# Patient Record
Sex: Male | Born: 1995 | Race: Black or African American | Hispanic: No | Marital: Single | Smoking: Current every day smoker
Health system: Southern US, Community
[De-identification: ages and names within clinical notes are randomized; demographics above are authoritative.]

## PROBLEM LIST (undated history)

## (undated) DIAGNOSIS — R51 Headache: Secondary | ICD-10-CM

## (undated) DIAGNOSIS — R519 Headache, unspecified: Secondary | ICD-10-CM

---
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
## 2015-02-05 ENCOUNTER — Encounter (HOSPITAL_COMMUNITY): Payer: Self-pay | Admitting: Emergency Medicine

## 2015-02-05 ENCOUNTER — Emergency Department (HOSPITAL_COMMUNITY)
Admission: EM | Admit: 2015-02-05 | Discharge: 2015-02-05 | Disposition: A | Discharge status: Home / Self Care | Payer: Medicaid Other | Attending: Emergency Medicine | Admitting: Emergency Medicine

## 2015-02-05 DIAGNOSIS — B86 Scabies: Secondary | ICD-10-CM | POA: Diagnosis not present

## 2015-02-05 DIAGNOSIS — Z720 Tobacco use: Secondary | ICD-10-CM | POA: Insufficient documentation

## 2015-02-05 DIAGNOSIS — R21 Rash and other nonspecific skin eruption: Secondary | ICD-10-CM | POA: Diagnosis present

## 2015-02-05 MED ORDER — PERMETHRIN 5 % EX CREA: TOPICAL_CREAM | CUTANEOUS | Status: AC

## 2015-02-05 NOTE — Discharge Instructions (Signed)
Scabies Scabies are small bugs (mites) that burrow under the skin and cause red bumps and severe itching. These bugs can only be seen with a microscope. Scabies are highly contagious. They can spread easily from person to person by direct contact. They are also spread through sharing clothing or linens that have the scabies mites living in them. It is not unusual for an entire family to become infected through shared towels, clothing, or bedding.  HOME CARE INSTRUCTIONS   Your caregiver may prescribe a cream or lotion to kill the mites. If cream is prescribed, massage the cream into the entire body from the neck to the bottom of both feet. Also massage the cream into the scalp and face if your child is less than 19 year old. Avoid the eyes and mouth. Do not wash your hands after application.  Leave the cream on for 8 to 12 hours. Your child should bathe or shower after the 8 to 12 hour application period. Sometimes it is helpful to apply the cream to your child right before bedtime.  One treatment is usually effective and will eliminate approximately 95% of infestations. For severe cases, your caregiver may decide to repeat the treatment in 1 week. Everyone in your household should be treated with one application of the cream.  New rashes or burrows should not appear within 24 to 48 hours after successful treatment. However, the itching and rash may last for 2 to 4 weeks after successful treatment. Your caregiver may prescribe a medicine to help with the itching or to help the rash go away more quickly.  Scabies can live on clothing or linens for up to 3 days. All of your child's recently used clothing, towels, stuffed toys, and bed linens should be washed in hot water and then dried in a dryer for at least 20 minutes on high heat. Items that cannot be washed should be enclosed in a plastic bag for at least 3 days.  To help relieve itching, bathe your child in a cool bath or apply cool washcloths to the  affected areas.  Your child may return to school after treatment with the prescribed cream. SEEK MEDICAL CARE IF:   The itching persists longer than 4 weeks after treatment.  The rash spreads or becomes infected. Signs of infection include red blisters or yellow-tan crust. Document Released: 08/12/2005 Document Revised: 11/04/2011 Document Reviewed: 12/21/2008 [facility] Patient Information 2015 [place], [place]. This information is not intended to replace advice given to you by your health care provider. Make sure you discuss any questions you have with your health care provider.  RESOURCE GUIDE  Chronic Pain Problems: Contact [facility]  [phone] Patients need to be referred by their primary care doctor.  Insufficient Money for Medicine: Contact United Way:  call "211" or [place] [phone].  No Primary Care Doctor: Call Health Connect  [phone] - can help you locate a primary care doctor that  accepts your insurance, provides certain services, etc. Physician Referral [phone]  Agencies that provide inexpensive medical care: [facility]  [facility]  [phone] [facility]  [phone] [facility]  [phone] Planned Parenthood  [phone] [facility]  [phone]  [place] Providers: [facility]- [address], [address]  [phone], Mon-Fri 9am-7pm, Sat 9am-1pm [place], [address] [place], [address]  [place]  [place], [address], [phone]  Only accepts [place] patients after they have their name  applied to their card  Self Pay (no insurance) in [facility]: Sickle Cell Patients: Dr Kevan Ny, [facility]  Medicine  [place], [facility]- [facility]  [facility] [place]- [address], [facility]- see information above (Speak to [employer] if you do not have insurance)       -  [facility]- [place], [place],  [place], [place]  Dr Vista Lawman-  [address], [place], [facility]- [address] [phone]       -  [facility]- [address], [place], also [address], [phone]       -    [facility]- [address], [place], 1st & 3rd Saturday   every month, 10am-1pm  1) Find a Doctor and Pay Out of Pocket Although you won't have to find out who is covered by your insurance plan, it is a good idea to ask around and get recommendations. You will then need to call the office and see if the doctor you have chosen will accept you as a new patient and what types of options they offer for patients who are self-pay. Some doctors offer discounts or will set up payment plans for their patients who do not have insurance, but you will need to ask so you aren't surprised when you get to your appointment.  2) Contact Your Local Health Department Not all health departments have doctors that can see patients for sick visits, but many do, so it is worth a call to see if yours does. If you don't know where your local health department is, you can check in your phone book. The CDC also has a tool to help you locate your state's health department, and many state websites also have listings of all of their local health departments.  3) Find a [facility] If your illness is not likely to be very severe or complicated, you may want to try a walk in clinic. These are popping up all over the  country in pharmacies, drugstores, and shopping centers. They're usually staffed by nurse practitioners or physician assistants that have been trained to treat common illnesses and complaints. They're usually fairly quick and inexpensive. However, if you have serious medical issues or chronic medical problems, these are probably not your best option  [facility] [place], [facility], [address] [place], phone [phone] or [phone].  Monday - Friday, call for an appointment. [place], [facility] Clinic, [address], [place], phone [phone] or [phone].  Monday - Friday, call for an appointment.  Abuse/Neglect: [place] [phone] [place] [phone] (After Hours)  Emergency Shelter:  [facility] [phone]  Maternity Homes: Room at the [place] [phone] [place] [phone]  MRSA Hotline #:   857-379-6564  [facility] Dept. [address]         [place] Phone:  [phone]                                  Phone:  [phone]                   Phone:  [phone]  [facility], [place]- [phone]       -     [facility] in [place], [address],                                  [place] [phone] or [phone] (After Hours)   [place]  Substance Abuse Resources: Alcohol and Drug Services  [phone] [place]  [phone] The [place] Chinita Pester [phone] Residential & Outpatient Substance Abuse Program  [phone]  Psychological Services: [place]  [phone] [place]  [place], [place]. [address] [place], [place]: [phone] or [phone], [email or web address]  Dental Assistance  If unable to pay or uninsured, contact:  [facility] or [facility]. to become qualified for the adult dental clinic.  Patients with Medicaid: [facility] [phone] [address] [place], [place] [address], [phone]  If unable to pay, or uninsured, contact [facility] [phone]) or [place] [phone] in [place], [place] in [facility]) to become qualified for the adult dental clinic  Other [place]- [place], [place], [place], [address], [place], [place], 2nd and 4th Thursday of the month at 6:30am.  10 clients each day by appointment, can sometimes see walk-in patients if someone does not show for an appointment. [facility]- [address] [place] Danker [place], [place], [address], [place], [place], [place], [address], [place] [facility] [phone]  Please make every effort to establish with a primary care physician for routine medical care  [place]  The [place] provides a wide range of adult health services. Some of these services are designed to address the healthcare needs of all [facility] residents and all services are designed to meet the needs of uninsured/underinsured low income residents. Some services are available to any  resident of [place], call  [phone] for details. ] The [facility], a new medical clinic for adults, is now open. For more information about the Center and its services please call [phone]. For information on our [place] services, click here.  For more information on any of the following Department of Public Health programs, including hours of service, click on the highlighted link.  SERVICES FOR WOMEN (Adults and Teens) [employer] provide a full range of birth control options plus education and counseling. New patient visit and annual return visits include a complete examination, pap test as indicated, and other laboratory as indicated. Included is our [employer] for men.  Maternity Care is provided through pregnancy, including a six week post partum exam. Women who meet eligibility criteria for the Medicaid for Pregnant Women program, receive care free. Other women are charged on a sliding scale according to income. Note: [facility] provides services to pregnant women who have a Medicaid card. Call [phone] for an appointment in [place] or [phone] for an appointment in [facility].  Primary Care for Medicaid [place] Access Women is available through the [place]. As primary care provider for the [place] program, women may designate the [facility] clinic as their primary care provider.  PLEASE CALL [phone] FOR AN APPOINTMENT FOR THE ABOVE SERVICES IN EITHER [place] OR [place]. Information available in [place] and [place].   Childbirth Education Classes are open to the public and offered to help families prepare for the best possible childbirth experience as well as to promote lifelong health and wellness. Classes are offered throughout the year and meet on the same night once a week for five weeks. Medicaid covers the cost of the classes for the  mother-to-be and her partner. For participants without Medicaid, the cost of the class series is $45.00 for the mother-to-be and her partner. Class size is limited and registration is required. For more information or to register call [phone]. Baby items donated by [employer] and the [employer] [place] are given away during each class series.  SERVICES FOR WOMEN AND MEN Sexually Transmitted Infection appointments, including HIV testing, are available daily (weekdays, except holidays). Call early as same-day appointments are limited. For an appointment in either [facility] or [place], call [phone]. Services are confidential and free of charge.  Skin Testing for Tuberculosis Please call [phone]. Adult Immunizations are available, usually for a fee. Please call [phone] for details.  PLEASE CALL [phone] FOR AN APPOINTMENT FOR THE ABOVE SERVICES IN EITHER [place] OR [place].   [facility] provides up to the minute recommended vaccines for your travel destination. We also provide essential health and political information to help insure a safe and pleasurable travel experience. This program is self-sustaining, however, fees are very competitive. We are a CERTIFIED YELLOW FEVER IMMUNIZATION approved clinic site. PLEASE CALL [phone] FOR AN APPOINTMENT IN EITHER [place] OR [place].   If you have questions about the services listed above, we want to answer them! Email [place] at: [email or web address] Home Visiting Services for elderly and the disabled are available to residents of [facility] who are in need of care that compares to the care offered by a nursing home, have needs that can be met by the program, and have [place] Medicaid. Other short term services are available to residents 18 years and older who are unable to meet requirements for eligibility  to receive services from a certified home health agency, spend the majority of time at home, and need  care for six months or less.  PLEASE CALL H548482 OR [phone] FOR MORE INFORMATION. Medication Assistance Program serves as a link between pharmaceutical companies and patients to provide low cost or free prescription medications. This servce is available for residents who meet certain income restrictions and have no insurance coverage.  PLEASE CALL [phone] ([place]) OR [phone] ([place]) FOR MORE INFORMATION.  Updated Feb. 21, 2013

## 2015-02-05 NOTE — ED Notes (Signed)
Pt. Stated, I got a rash all over for 2 weeks, it itches.

## 2015-02-05 NOTE — ED Provider Notes (Signed)
CSN: [id number]     Arrival date & time 02/05/15  1212 History  This chart was scribed for Marlon Pel, PA-C, working with Donnetta Hutching, MD by Chestine Spore, ED Scribe. The patient was seen in room TR05C/TR05C at 1:47 PM.    Chief Complaint  Patient presents with  . Rash      The history is provided by the patient. No language interpreter was used.     HPI Comments: George Arnold is a 19 y.o. male who presents to the Emergency Department complaining of rash onset 2 weeks. He reports that he has bumps to his bilateral legs, bilateral arms, and buttocks. Denies rash on chest or abdomen. He reports that the rash  The rash started after staying with a friend. The areas itch. He states that he is having associated symptoms of redness. He states that he has tried ointment with no relief for his symptoms. He denies fevers, n/v, and any other symptoms.  Past Medical History  Diagnosis Date  . Generalized headaches    History reviewed. No pertinent past surgical history. No family history on file. History  Substance Use Topics  . Smoking status: Current Every Day Smoker -- 0.50 packs/day  . Smokeless tobacco: Not on file  . Alcohol Use: Yes     Comment: very rarely    Review of Systems  Constitutional: Negative for fever.  Gastrointestinal: Negative for nausea and vomiting.  Skin: Positive for color change (redness) and rash.      Allergies  Review of patient's allergies indicates no known allergies.  Home Medications   Prior to Admission medications   Medication Sig Start Date End Date Taking? Authorizing Provider  HYDROcodone-acetaminophen (HYCET) 7.5-325 mg/15 ml solution Take 10 mLs by mouth 4 (four) times daily as needed for moderate pain or severe pain. 07/20/14   Trixie Dredge, PA-C  permethrin (ELIMITE) 5 % cream Apply to affected area once 02/05/15   Seleny Allbright, PA-C   BP 135/65 mmHg  Pulse 70  Temp(Src) 98.3 F (36.8 C) (Oral)  Resp 16  Ht 5\' 10"  (1.778 m)  Wt 138  lb (62.596 kg)  BMI 19.80 kg/m2  SpO2 100% Physical Exam  Constitutional: He is oriented to person, place, and time. He appears well-developed and well-nourished. No distress.  HENT:  Head: Normocephalic and atraumatic.  Eyes: EOM are normal.  Neck: Neck supple. No tracheal deviation present.  Cardiovascular: Normal rate.   Pulmonary/Chest: Effort normal. No respiratory distress.  Musculoskeletal: Normal range of motion.  Neurological: He is alert and oriented to person, place, and time.  Skin: Skin is warm and dry.  Tiny burrows of grayish-white lines on the skin surface.  Psychiatric: He has a normal mood and affect. His behavior is normal.  Nursing note and vitals reviewed.   ED Course  Procedures (including critical care time) DIAGNOSTIC STUDIES: Oxygen Saturation is 98% on RA, nl by my interpretation.    COORDINATION OF CARE: 1:49 PM-Discussed treatment plan which includes Permethrin Rx with pt at bedside and pt agreed to plan.   Labs Review Labs Reviewed - No data to display  Imaging Review No results found.   EKG Interpretation None      MDM   Final diagnoses:  Scabies    Rx : Permethrin Cream Derm referral - education on how to use medication and wash all clothes and bedding explained.  Medications - No data to display  19 y.o.Kevan Ny Elsasser's evaluation in the Emergency Department is complete.  It has been determined that no acute conditions requiring further emergency intervention are present at this time. The patient/guardian have been advised of the diagnosis and plan. We have discussed signs and symptoms that warrant return to the ED, such as changes or worsening in symptoms.  Vital signs are stable at discharge. Filed Vitals:   02/05/15 1405  BP: 135/65  Pulse: 70  Temp: 98.3 F (36.8 C)  Resp: 16    Patient/guardian has voiced understanding and agreed to follow-up with the PCP or specialist.  I personally performed the services described in  this documentation, which was scribed in my presence. The recorded information has been reviewed and is accurate.   Marlon Pel, PA-C 02/10/15 0940  Marlon Pel, PA-C 02/10/15 1610  Marlon Pel, PA-C 02/10/15 9604  Donnetta Hutching, MD 02/11/15 1054

## 2015-02-05 NOTE — ED Notes (Signed)
Declined W/C at D/C and was escorted to lobby by RN. 

## 2015-02-08 ENCOUNTER — Emergency Department (HOSPITAL_COMMUNITY)
Admission: EM | Admit: 2015-02-08 | Discharge: 2015-02-10 | Discharge status: Against Medical Advice | Payer: Medicaid Other | Attending: Emergency Medicine | Admitting: Emergency Medicine

## 2015-02-08 ENCOUNTER — Encounter (HOSPITAL_COMMUNITY): Payer: Self-pay | Admitting: *Deleted

## 2015-02-08 DIAGNOSIS — Z720 Tobacco use: Secondary | ICD-10-CM | POA: Diagnosis not present

## 2015-02-08 DIAGNOSIS — R21 Rash and other nonspecific skin eruption: Secondary | ICD-10-CM | POA: Diagnosis not present

## 2015-02-08 NOTE — ED Notes (Signed)
Pt in c/o rash to bilateral hands, groin area and feet, states he first noticed it two weeks ago when he started staying at a friends house, no distress noted

## 2015-02-10 ENCOUNTER — Encounter (HOSPITAL_COMMUNITY): Payer: Self-pay

## 2015-02-10 ENCOUNTER — Emergency Department (HOSPITAL_COMMUNITY)
Admission: EM | Admit: 2015-02-10 | Discharge: 2015-02-10 | Disposition: A | Discharge status: Home / Self Care | Payer: Medicaid Other | Attending: Emergency Medicine | Admitting: Emergency Medicine

## 2015-02-10 DIAGNOSIS — H1132 Conjunctival hemorrhage, left eye: Secondary | ICD-10-CM | POA: Insufficient documentation

## 2015-02-10 DIAGNOSIS — R21 Rash and other nonspecific skin eruption: Secondary | ICD-10-CM | POA: Diagnosis present

## 2015-02-10 DIAGNOSIS — Z720 Tobacco use: Secondary | ICD-10-CM | POA: Insufficient documentation

## 2015-02-10 DIAGNOSIS — B353 Tinea pedis: Secondary | ICD-10-CM | POA: Insufficient documentation

## 2015-02-10 MED ORDER — DIPHENHYDRAMINE HCL 25 MG PO TABS: 25.0000 mg | ORAL_TABLET | Freq: Four times a day (QID) | ORAL | Status: AC

## 2015-02-10 MED ORDER — TERBINAFINE HCL 1 % EX CREA: 1.0000 "application " | TOPICAL_CREAM | Freq: Two times a day (BID) | CUTANEOUS | Status: AC
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
## 2015-02-10 NOTE — ED Notes (Signed)
Pt. Was here 2 days ago for a rash starting 2 weeks ago. LWBS. States he has rash to bilateral groin, hands, and legs. States friend had bed bugs. Denies discharge, trouble urinating or pain. States rash itches.

## 2015-02-10 NOTE — Discharge Instructions (Signed)
Please apply pemethrin cream from neck to toes and leave it on for 8 hrs then washed off completely.  Wash all of your belongings in hot water to prevent scabies.  Take benadryl as needed for itch.  Trim your nails short to prevent scratching and causing infection.  Apply lamisil cream to both feet twice daily for 3 weeks for treatment for athletic foot.    Athlete's Foot Athlete's foot (tinea pedis) is a fungal infection of the skin on the feet. It often occurs on the skin between the toes or underneath the toes. It can also occur on the soles of the feet. Athlete's foot is more likely to occur in hot, humid weather. Not washing your feet or changing your socks often enough can contribute to athlete's foot. The infection can spread from person to person (contagious). CAUSES Athlete's foot is caused by a fungus. This fungus thrives in warm, moist places. Most people get athlete's foot by sharing shower stalls, towels, and wet floors with an infected person. People with weakened immune systems, including those with diabetes, may be more likely to get athlete's foot. SYMPTOMS   Itchy areas between the toes or on the soles of the feet.  White, flaky, or scaly areas between the toes or on the soles of the feet.  Tiny, intensely itchy blisters between the toes or on the soles of the feet.  Tiny cuts on the skin. These cuts can develop a bacterial infection.  Thick or discolored toenails. DIAGNOSIS  Your caregiver can usually tell what the problem is by doing a physical exam. Your caregiver may also take a skin sample from the rash area. The skin sample may be examined under a microscope, or it may be tested to see if fungus will grow in the sample. A sample may also be taken from your toenail for testing. TREATMENT  Over-the-counter and prescription medicines can be used to kill the fungus. These medicines are available as powders or creams. Your caregiver can suggest medicines for you. Fungal  infections respond slowly to treatment. You may need to continue using your medicine for several weeks. PREVENTION   Do not share towels.  Wear sandals in wet areas, such as shared locker rooms and shared showers.  Keep your feet dry. Wear shoes that allow air to circulate. Wear cotton or wool socks. HOME CARE INSTRUCTIONS   Take medicines as directed by your caregiver. Do not use steroid creams on athlete's foot.  Keep your feet clean and cool. Wash your feet daily and dry them thoroughly, especially between your toes.  Change your socks every day. Wear cotton or wool socks. In hot climates, you may need to change your socks 2 to 3 times per day.  Wear sandals or canvas tennis shoes with good air circulation.  If you have blisters, soak your feet in Burow's solution or Epsom salts for 20 to 30 minutes, 2 times a day to dry out the blisters. Make sure you dry your feet thoroughly afterward. SEEK MEDICAL CARE IF:   You have a fever.  You have swelling, soreness, warmth, or redness in your foot.  You are not getting better after 7 days of treatment.  You are not completely cured after 30 days.  You have any problems caused by your medicines. MAKE SURE YOU:   Understand these instructions.  Will watch your condition.  Will get help right away if you are not doing well or get worse. Document Released: 08/09/2000 Document Revised: 11/04/2011 Document  Reviewed: 05/31/2011 [employer] Patient Information 2015 [place], [place]. This information is not intended to replace advice given to you by your health care provider. Make sure you discuss any questions you have with your health care provider.  Rash A rash is a change in the color or texture of your skin. There are many different types of rashes. You may have other problems that accompany your rash. CAUSES   Infections.  Allergic reactions. This can include allergies to pets or foods.  Certain medicines.  Exposure to certain  chemicals, soaps, or cosmetics.  Heat.  Exposure to poisonous plants.  Tumors, both cancerous and noncancerous. SYMPTOMS   Redness.  Scaly skin.  Itchy skin.  Dry or cracked skin.  Bumps.  Blisters.  Pain. DIAGNOSIS  Your caregiver may do a physical exam to determine what type of rash you have. A skin sample (biopsy) may be taken and examined under a microscope. TREATMENT  Treatment depends on the type of rash you have. Your caregiver may prescribe certain medicines. For serious conditions, you may need to see a skin doctor (dermatologist). HOME CARE INSTRUCTIONS   Avoid the substance that caused your rash.  Do not scratch your rash. This can cause infection.  You may take cool baths to help stop itching.  Only take over-the-counter or prescription medicines as directed by your caregiver.  Keep all follow-up appointments as directed by your caregiver. SEEK IMMEDIATE MEDICAL CARE IF:  You have increasing pain, swelling, or redness.  You have a fever.  You have new or severe symptoms.  You have body aches, diarrhea, or vomiting.  Your rash is not better after 3 days. MAKE SURE YOU:  Understand these instructions.  Will watch your condition.  Will get help right away if you are not doing well or get worse. Document Released: 08/02/2002 Document Revised: 11/04/2011 Document Reviewed: 05/27/2011 [facility] Patient Information 2015 [place], [place]. This information is not intended to replace advice given to you by your health care provider. Make sure you discuss any questions you have with your health care provider.

## 2018-07-29 ENCOUNTER — Emergency Department (HOSPITAL_COMMUNITY)
Admission: EM | Admit: 2018-07-29 | Discharge: 2018-07-30 | Disposition: A | Discharge status: Home / Self Care | Payer: Medicaid Other | Attending: Emergency Medicine | Admitting: Emergency Medicine

## 2018-07-29 ENCOUNTER — Encounter (HOSPITAL_COMMUNITY): Payer: Self-pay

## 2018-07-29 ENCOUNTER — Emergency Department (HOSPITAL_COMMUNITY): Payer: Medicaid Other

## 2018-07-29 DIAGNOSIS — R079 Chest pain, unspecified: Secondary | ICD-10-CM | POA: Insufficient documentation

## 2018-07-29 DIAGNOSIS — Z5321 Procedure and treatment not carried out due to patient leaving prior to being seen by health care provider: Secondary | ICD-10-CM | POA: Insufficient documentation

## 2018-07-29 LAB — CBC
HCT: 41.8 % (ref 39.0–52.0)
HEMOGLOBIN: 13.3 g/dL (ref 13.0–17.0)
MCH: 28.2 pg (ref 26.0–34.0)
MCHC: 31.8 g/dL (ref 30.0–36.0)
MCV: 88.7 fL (ref 80.0–100.0)
Platelets: 252 10*3/uL (ref 150–400)
RBC: 4.71 MIL/uL (ref 4.22–5.81)
RDW: 13.7 % (ref 11.5–15.5)
WBC: 13.9 10*3/uL — ABNORMAL HIGH (ref 4.0–10.5)
nRBC: 0 % (ref 0.0–0.2)

## 2018-07-29 LAB — BASIC METABOLIC PANEL
ANION GAP: 8 (ref 5–15)
BUN: 14 mg/dL (ref 6–20)
CO2: 28 mmol/L (ref 22–32)
Calcium: 9.5 mg/dL (ref 8.9–10.3)
Chloride: 103 mmol/L (ref 98–111)
Creatinine, Ser: 0.95 mg/dL (ref 0.61–1.24)
GFR calc Af Amer: >60 mL/min (ref >60)
GLUCOSE: 88 mg/dL (ref 70–99)
POTASSIUM: 4 mmol/L (ref 3.5–5.1)
Sodium: 139 mmol/L (ref 135–145)

## 2018-07-29 NOTE — ED Triage Notes (Signed)
Pt states that about two hour ago he began to have central CP with nausea, worse with movement

## 2018-07-30 LAB — I-STAT TROPONIN, ED: TROPONIN I, POC: 0 ng/mL (ref 0.00–0.08)

## 2018-07-30 NOTE — ED Notes (Signed)
Unable to locate pt in the waiting area.

## 2018-07-30 NOTE — ED Notes (Signed)
Istat trop resulted at 22:27 = 0.00, error in results crossing over.

## 2020-03-16 IMAGING — DX DG CHEST 2V
2 series · 2 of 2 positions shown · non-contrast
Comparison: None.

CLINICAL DATA: Chest pain and dyspnea with sore throat today.

EXAM:
CHEST - 2 VIEW

[w chest pa]
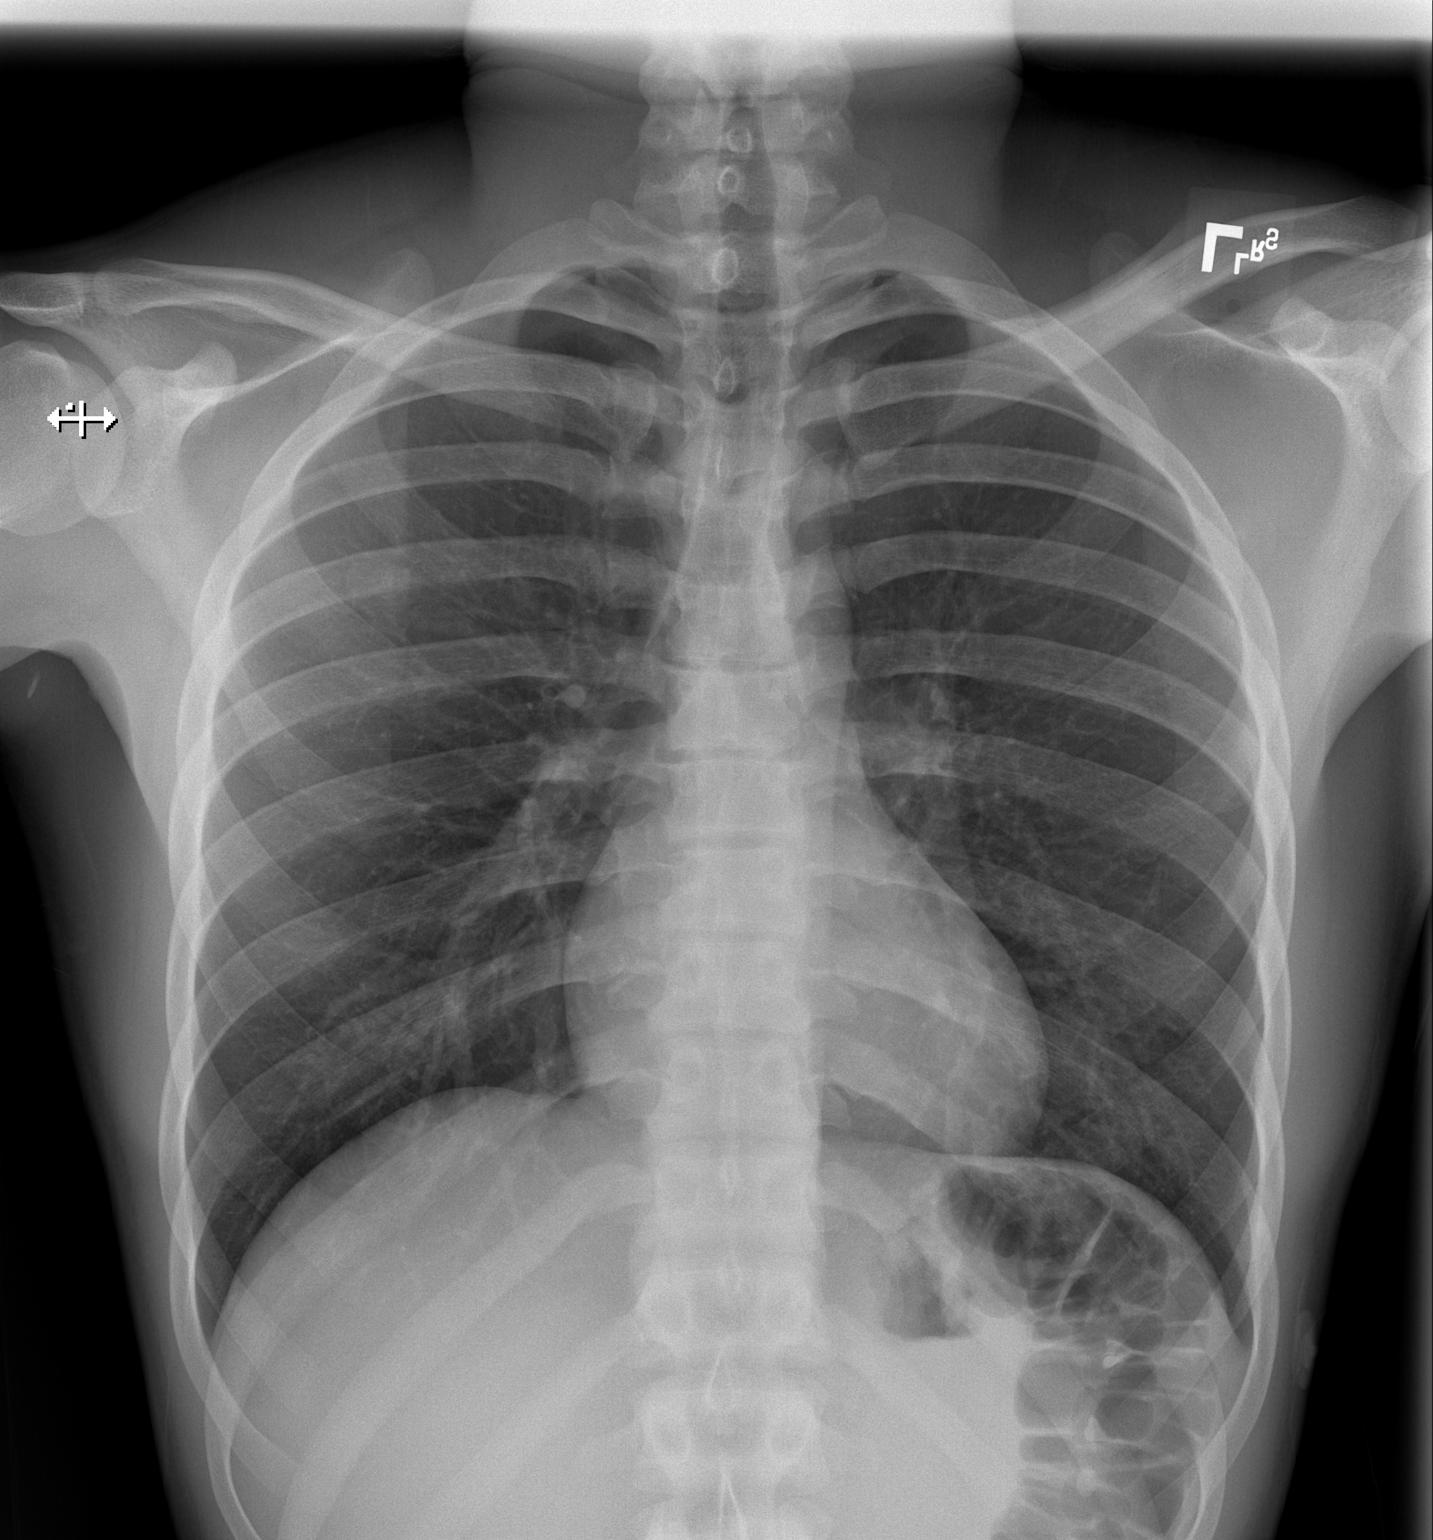

[w chest lat]
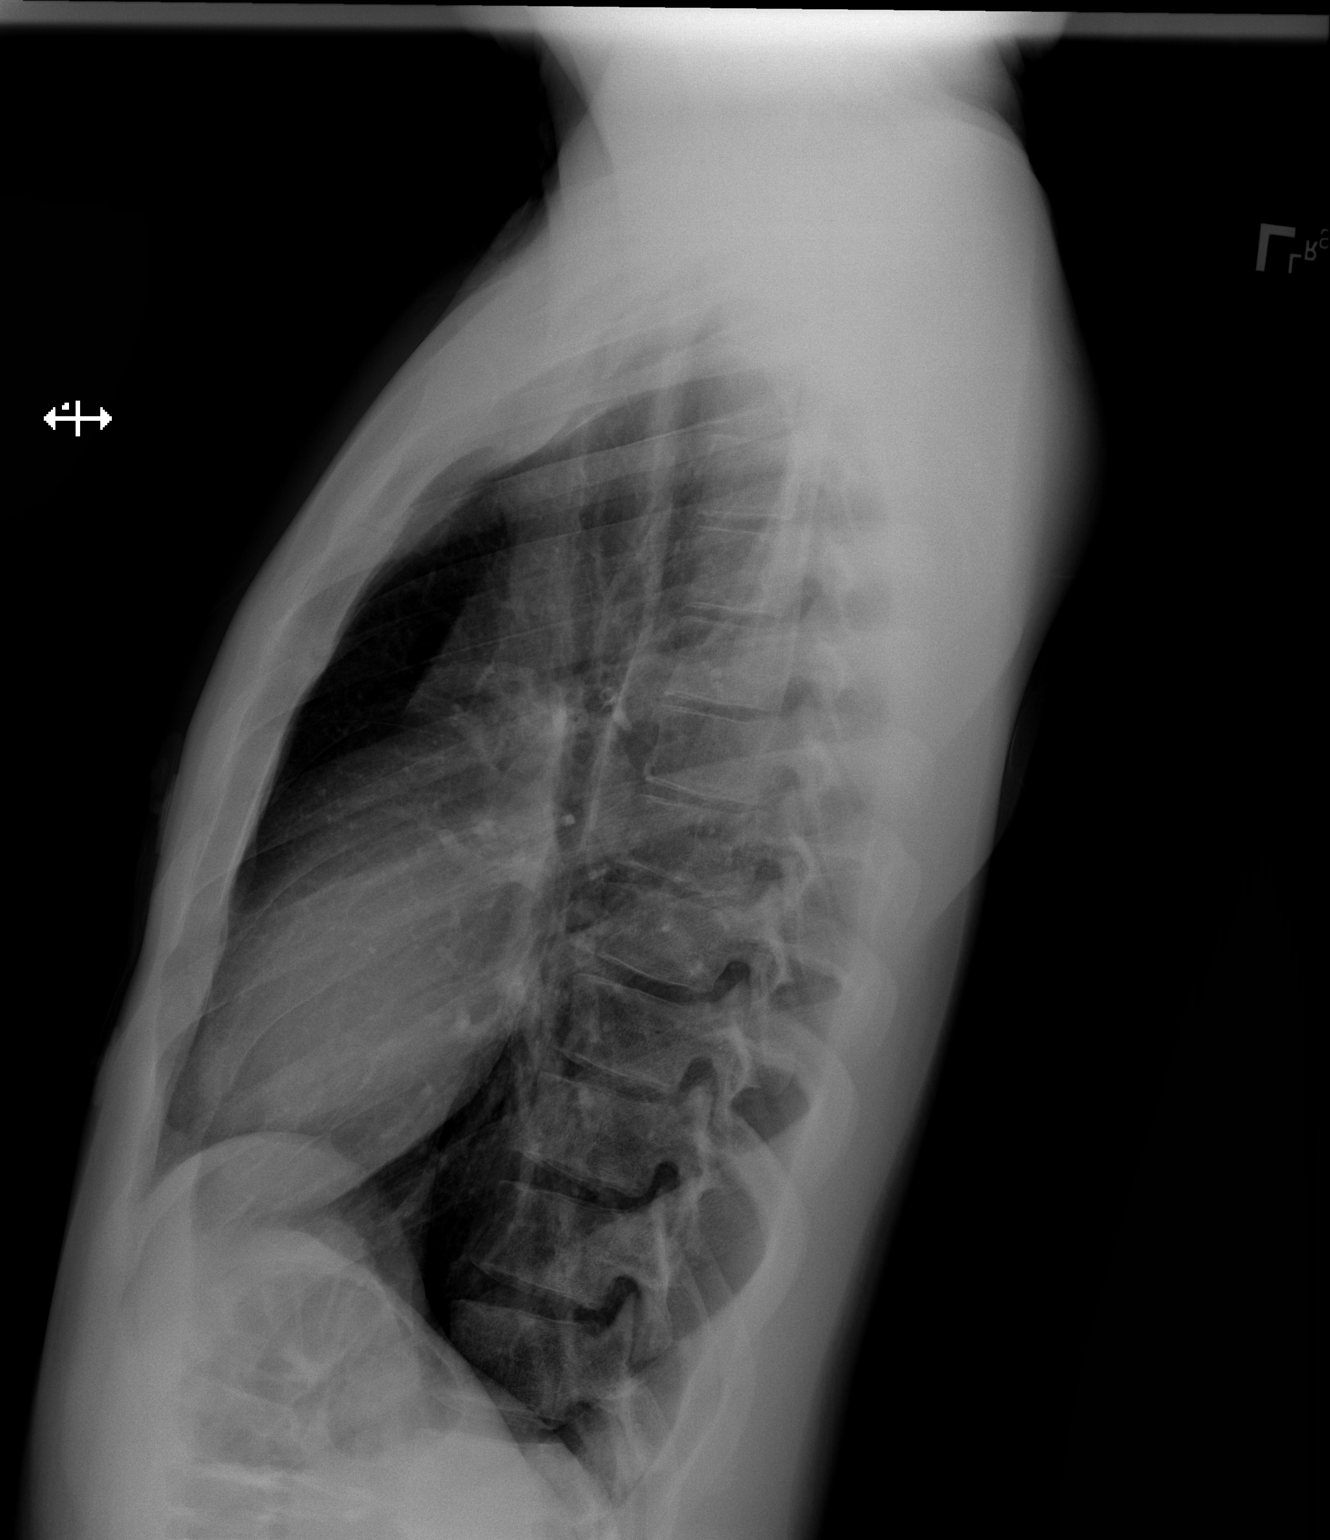

[2 of 2 positions shown; findings below may reference images not displayed]

FINDINGS: The heart size and mediastinal contours are within normal limits.
Both lungs are clear. The visualized skeletal structures are
unremarkable.
IMPRESSION: No active cardiopulmonary disease.

## 2020-09-29 ENCOUNTER — Other Ambulatory Visit: Payer: Self-pay

## 2020-09-29 ENCOUNTER — Encounter (HOSPITAL_COMMUNITY): Payer: Self-pay | Admitting: Emergency Medicine

## 2020-09-29 ENCOUNTER — Ambulatory Visit (HOSPITAL_COMMUNITY)
Admission: EM | Admit: 2020-09-29 | Discharge: 2020-09-29 | Disposition: A | Discharge status: Home / Self Care | Payer: Medicaid Other | Attending: Emergency Medicine | Admitting: Emergency Medicine

## 2020-09-29 DIAGNOSIS — R1013 Epigastric pain: Secondary | ICD-10-CM

## 2020-09-29 DIAGNOSIS — M549 Dorsalgia, unspecified: Secondary | ICD-10-CM

## 2020-09-29 DIAGNOSIS — R109 Unspecified abdominal pain: Secondary | ICD-10-CM

## 2020-09-29 DIAGNOSIS — R197 Diarrhea, unspecified: Secondary | ICD-10-CM

## 2020-09-29 MED ORDER — DICYCLOMINE HCL 20 MG PO TABS: 20.0000 mg | ORAL_TABLET | Freq: Two times a day (BID) | ORAL | 0 refills | Status: AC | PRN

## 2020-09-29 MED ORDER — OMEPRAZOLE 20 MG PO CPDR: 20.0000 mg | DELAYED_RELEASE_CAPSULE | Freq: Every day | ORAL | 0 refills | Status: AC

## 2020-09-29 NOTE — ED Provider Notes (Signed)
[facility]    CSN: [id number] Arrival date & time: 09/29/20  1631      History   Chief Complaint Chief Complaint  Patient presents with  . Bloated  . Back Pain    HPI George Arnold is a 25 y.o. male.   George Arnold presents with multiple chronic complaints. States for months now he has woken up with soreness to upper back and posterior neck. He has tried going to the chiropractor as well as changing how he sleeps but it hasn't improved. Originally ibuprofen did help but he feels like it is less helpful now. No injury. 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 Also complains of intermittent abdominal symptoms which have been ongoing for months now as well. Feels bloated after eating. Intermittent diarrhea and sometimes even has vomiting. No fevers. No other body aches. Denies any urinary symptoms.  Denies any known blood or mucus to stool. States he has been evaluated for sti's in the past but denies any other evaluation for this in the past. States he smokes marijuana daily.     ROS per HPI, negative if not otherwise mentioned.      Past Medical History:  Diagnosis Date  . Generalized headaches     There are no problems to display for this patient.   History reviewed. No pertinent surgical history.     Home Medications    Prior to Admission medications   Medication Sig Start Date End Date Taking? Authorizing Provider  dicyclomine (BENTYL) 20 MG tablet Take 1 tablet (20 mg total) by mouth 2 (two) times daily as needed for spasms (abdominal spasms). 09/29/20  Yes Linus Mako B, NP  omeprazole (PRILOSEC) 20 MG capsule Take 1 capsule (20 mg total) by mouth daily. 09/29/20  Yes Linus Mako B, NP  diphenhydrAMINE (BENADRYL) 25 MG tablet Take 1 tablet (25 mg total) by mouth every 6 (six) hours. 02/10/15   Fayrene Helper, PA-C  HYDROcodone-acetaminophen (HYCET) 7.5-325 mg/15 ml solution Take 10 mLs by mouth 4 (four) times  daily as needed for moderate pain or severe pain. 07/20/14   Trixie Dredge, PA-C  permethrin (ELIMITE) 5 % cream Apply to affected area once 02/05/15   Marlon Pel, PA-C  terbinafine (LAMISIL AT) 1 % cream Apply 1 application topically 2 (two) times daily. Apply to both feet twice daily for 3 weeks 02/10/15   Fayrene Helper, PA-C    Family History History reviewed. No pertinent family history.  Social History Social History   Tobacco Use  . Smoking status: Current Every Day Smoker    Packs/day: 0.50  Substance Use Topics  . Alcohol use: Yes    Comment: very rarely  . Drug use: Yes    Types: Marijuana     Allergies   Patient has no known allergies.   Review of Systems Review of Systems   Physical Exam Triage Vital Signs ED Triage Vitals  Enc Vitals Group     BP 09/29/20 1700 129/66     Pulse Rate 09/29/20 1700 75     Resp 09/29/20 1700 16     Temp 09/29/20 1700 98.5 F (36.9 C)     Temp Source 09/29/20 1700 Oral     SpO2 09/29/20 1700 96 %     Weight --      Height --      Head Circumference --      Peak Flow --      Pain  Score 09/29/20 1659 0     Pain Loc --      Pain Edu? --      Excl. in GC? --    No data found.  Updated Vital Signs BP 129/66 (BP Location: Right Arm)   Pulse 75   Temp 98.5 F (36.9 C) (Oral)   Resp 16   SpO2 96%   Physical Exam Constitutional:      Appearance: He is well-developed.  Cardiovascular:     Rate and Rhythm: Normal rate.  Pulmonary:     Effort: Pulmonary effort is normal.  Abdominal:     General: There is no distension.     Palpations: There is no mass.     Tenderness: There is no abdominal tenderness. There is no right CVA tenderness, left CVA tenderness or guarding.  Musculoskeletal:     Thoracic back: No spasms or bony tenderness. Normal range of motion.     Comments: Indicates bilateral upper back tenderness without any bony tenderness; full ROM of neck and bilateral arms  Skin:    General: Skin is warm and dry.   Neurological:     Mental Status: He is alert and oriented to person, place, and time.      UC Treatments / Results  Labs (all labs ordered are listed, but only abnormal results are displayed) Labs Reviewed - No data to display  EKG   Radiology No results found.  Procedures Procedures (including critical care time)  Medications Ordered in UC Medications - No data to display  Initial Impression / Assessment and Plan / UC Course  I have reviewed the triage vital signs and the nursing notes.  Pertinent labs & imaging results that were available during my care of the patient were reviewed by me and considered in my medical decision making (see chart for details).     Non toxic. Benign physical exam.  Muscle soreness, encouraged stretching, strengthening and appropriate sleeping arrangements. Chronic abdominal complaints. In further discussion he does smoke marijuana daily, discussed it's affects on the gi tract and encouraged to trial stopping this to see if symptoms improve. Gerd, allergens, inflammatory etiology also all considered. Encouraged to follow up with pcp for recheck and further evaluation if persistent. Patient verbalized understanding and agreeable to plan.   Final Clinical Impressions(s) / UC Diagnoses   Final diagnoses:  Upper back pain  Abdominal pain, epigastric  Abdominal cramping  Intermittent diarrhea     Discharge Instructions     Light and regular activity, stretching, heat, massage to your upper back. Strengthening of your chest and back to help prevent soreness.  Bentyl may be used as needed for abdominal cramping. Omeprazole daily may also help, particularly with the upper abdominal pain.  Marijuana used daily can contribute to gastrointestinal issues such as vomiting and diarrhea, so I do recommend quitting for a period of time to see if your symptoms improve.  Please establish with a primary care provider as you may need further evaluation if  your symptoms persist.    ED Prescriptions    Medication Sig Dispense Auth. Provider   dicyclomine (BENTYL) 20 MG tablet Take 1 tablet (20 mg total) by mouth 2 (two) times daily as needed for spasms (abdominal spasms). 20 tablet Linus Mako B, NP   omeprazole (PRILOSEC) 20 MG capsule Take 1 capsule (20 mg total) by mouth daily. 30 capsule Georgetta Haber, NP     PDMP not reviewed this encounter.   Georgetta Haber, NP 09/29/20  1745  

## 2020-09-29 NOTE — ED Triage Notes (Signed)
Pt presents with abdominal bloating and back pain, nausea and diarrhea. States has been going on for months. Also c/o excessive underarm sweating.

## 2020-09-29 NOTE — Discharge Instructions (Signed)
Light and regular activity, stretching, heat, massage to your upper back. Strengthening of your chest and back to help prevent soreness.  Bentyl may be used as needed for abdominal cramping. Omeprazole daily may also help, particularly with the upper abdominal pain.  Marijuana used daily can contribute to gastrointestinal issues such as vomiting and diarrhea, so I do recommend quitting for a period of time to see if your symptoms improve.  Please establish with a primary care provider as you may need further evaluation if your symptoms persist.

## 2020-10-03 ENCOUNTER — Encounter (HOSPITAL_COMMUNITY): Payer: Self-pay | Admitting: Emergency Medicine

## 2020-10-03 ENCOUNTER — Emergency Department (HOSPITAL_COMMUNITY)
Admission: EM | Admit: 2020-10-03 | Discharge: 2020-10-03 | Disposition: A | Discharge status: Home / Self Care | Payer: Medicaid Other | Attending: Emergency Medicine | Admitting: Emergency Medicine

## 2020-10-03 DIAGNOSIS — M62830 Muscle spasm of back: Secondary | ICD-10-CM | POA: Insufficient documentation

## 2020-10-03 DIAGNOSIS — F17200 Nicotine dependence, unspecified, uncomplicated: Secondary | ICD-10-CM | POA: Insufficient documentation

## 2020-10-03 MED ORDER — NAPROXEN 500 MG PO TABS
500.0000 mg | ORAL_TABLET | Freq: Two times a day (BID) | ORAL | 0 refills | Status: AC
Start: 2020-10-03 — End: 2020-10-10

## 2020-10-03 MED ORDER — METHOCARBAMOL 500 MG PO TABS: 500.0000 mg | ORAL_TABLET | Freq: Two times a day (BID) | ORAL | 0 refills | Status: AC
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
## 2020-10-03 NOTE — Discharge Instructions (Signed)

## 2020-10-03 NOTE — ED Provider Notes (Signed)
[facility] EMERGENCY DEPARTMENT Provider Note   CSN: [id number] Arrival date & time: 10/03/20  1339     History Chief Complaint  Patient presents with  . Back Pain    George Arnold is a 25 y.o. male.  HPI   25 year old male with a history of generalized headaches presents the emergency department today for evaluation of back pain.  Patient states that he has had upper and lower back pain for the last several months.  It is located mostly on the right side of his back.  Pain is constant in nature.  It seems to improve after stretching temporarily.  He denies any associated fevers, numbness, weakness, history of IV drug use/cancer, loss control of bowel or bladder function.  He notes that he typically plays video games about 3 to 4 hours/day and when he does this he often leans forward with his elbows on his knees for long periods of time.  He denies any recent heavy lifting or falls.  Triage note indicates the patient has multiple other complaints that he does not mention any of these complaints during my evaluation.  Past Medical History:  Diagnosis Date  . Generalized headaches     There are no problems to display for this patient.   History reviewed. No pertinent surgical history.     No family history on file.  Social History   Tobacco Use  . Smoking status: Current Every Day Smoker    Packs/day: 0.50  Substance Use Topics  . Alcohol use: Yes    Comment: very rarely  . Drug use: Yes    Types: Marijuana    Home Medications Prior to Admission medications   Medication Sig Start Date End Date Taking? Authorizing Provider  methocarbamol (ROBAXIN) 500 MG tablet Take 1 tablet (500 mg total) by mouth 2 (two) times daily. 10/03/20  Yes Jenniferann Stuckert S, PA-C  naproxen (NAPROSYN) 500 MG tablet Take 1 tablet (500 mg total) by mouth 2 (two) times daily for 7 days. 10/03/20 10/10/20 Yes Tracie Lindbloom S, PA-C  dicyclomine (BENTYL) 20 MG tablet Take 1 tablet (20  mg total) by mouth 2 (two) times daily as needed for spasms (abdominal spasms). 09/29/20   Georgetta Haber, NP  diphenhydrAMINE (BENADRYL) 25 MG tablet Take 1 tablet (25 mg total) by mouth every 6 (six) hours. 02/10/15   Fayrene Helper, PA-C  HYDROcodone-acetaminophen (HYCET) 7.5-325 mg/15 ml solution Take 10 mLs by mouth 4 (four) times daily as needed for moderate pain or severe pain. 07/20/14   Trixie Dredge, PA-C  omeprazole (PRILOSEC) 20 MG capsule Take 1 capsule (20 mg total) by mouth daily. 09/29/20   Georgetta Haber, NP  permethrin (ELIMITE) 5 % cream Apply to affected area once 02/05/15   Marlon Pel, PA-C  terbinafine (LAMISIL AT) 1 % cream Apply 1 application topically 2 (two) times daily. Apply to both feet twice daily for 3 weeks 02/10/15   Fayrene Helper, PA-C    Allergies    Patient has no known allergies.  Review of Systems   Review of Systems  Constitutional: Negative for fever.  HENT: Negative for sore throat.   Eyes: Negative for visual disturbance.  Respiratory: Negative for shortness of breath.   Cardiovascular: Negative for chest pain.  Genitourinary: Negative for flank pain.       No loss of control of bowel or bladder function  Musculoskeletal: Positive for back pain.  Skin: Negative for rash.  Neurological: Negative for weakness and  numbness.  All other systems reviewed and are negative.   Physical Exam Updated Vital Signs BP 121/83   Pulse 87   Temp 98.8 F (37.1 C) (Oral)   Resp 17   Ht 5\' 9"  (1.753 m)   Wt 78 kg   SpO2 97%   BMI 25.40 kg/m   Physical Exam Vitals and nursing note reviewed.  Constitutional:      Appearance: He is well-developed and well-nourished.  HENT:     Head: Normocephalic and atraumatic.  Eyes:     Conjunctiva/sclera: Conjunctivae normal.  Cardiovascular:     Rate and Rhythm: Normal rate and regular rhythm.     Heart sounds: Normal heart sounds. No murmur heard.   Pulmonary:     Effort: Pulmonary effort is normal. No  respiratory distress.     Breath sounds: Normal breath sounds. No wheezing, rhonchi or rales.  Abdominal:     General: Bowel sounds are normal.     Palpations: Abdomen is soft.     Tenderness: There is no abdominal tenderness. There is no guarding or rebound.  Musculoskeletal:        General: No edema.     Cervical back: Neck supple.     Comments: No focal TTP to the cervical, thoracic, or lumbar spine. TTP noted to the right thoracic and lumbar paraspinous muscles which reproduces pain  Skin:    General: Skin is warm and dry.  Neurological:     Mental Status: He is alert.     Comments: Motor:  Normal tone. 5/5 strength of BUE and BLE major muscle groups including strong and equal grip strength and dorsiflexion/plantar flexion Sensory: light touch normal in all extremities. Gait: normal gait and balance.    Psychiatric:        Mood and Affect: Mood and affect normal.     ED Results / Procedures / Treatments   Labs (all labs ordered are listed, but only abnormal results are displayed) Labs Reviewed - No data to display  EKG None  Radiology No results found.  Procedures Procedures   Medications Ordered in ED Medications - No data to display  ED Course  I have reviewed the triage vital signs and the nursing notes.  Pertinent labs & imaging results that were available during my care of the patient were reviewed by me and considered in my medical decision making (see chart for details).    MDM Rules/Calculators/A&P                          Patient with back pain.  No neurological deficits and normal neuro exam.  Patient can walk but states is painful.  No loss of bowel or bladder control.  No concern for cauda equina.  No fever, night sweats, weight loss, h/o cancer, IVDU.  RICE protocol and pain medicine indicated and discussed with patient.    Final Clinical Impression(s) / ED Diagnoses Final diagnoses:  Muscle spasm of back    Rx / DC Orders ED Discharge  Orders         Ordered    naproxen (NAPROSYN) 500 MG tablet  2 times daily        10/03/20 1417    methocarbamol (ROBAXIN) 500 MG tablet  2 times daily        10/03/20 [address] Darleny Sem S, PA-C 10/03/20 1418    12/01/20, MD 10/03/20 1636
